# Patient Record
Sex: Female | Born: 1950 | Race: White | Hispanic: No | Marital: Married | Smoking: Current every day smoker
Health system: Southern US, Community
[De-identification: ages and names within clinical notes are randomized; demographics above are authoritative.]

---

## 2002-07-30 ENCOUNTER — Encounter: Payer: Self-pay | Admitting: Emergency Medicine

## 2002-07-30 ENCOUNTER — Emergency Department (HOSPITAL_COMMUNITY): Admission: EM | Admit: 2002-07-30 | Discharge: 2002-07-31 | Payer: Self-pay | Admitting: Emergency Medicine

## 2011-01-27 ENCOUNTER — Emergency Department (HOSPITAL_COMMUNITY): Payer: BC Managed Care – PPO

## 2011-01-27 ENCOUNTER — Emergency Department (HOSPITAL_COMMUNITY)
Admission: EM | Admit: 2011-01-27 | Discharge: 2011-01-27 | Disposition: A | Discharge status: Home / Self Care | Payer: BC Managed Care – PPO | Attending: Emergency Medicine | Admitting: Emergency Medicine

## 2011-01-27 DIAGNOSIS — S92919A Unspecified fracture of unspecified toe(s), initial encounter for closed fracture: Secondary | ICD-10-CM | POA: Insufficient documentation

## 2011-01-27 DIAGNOSIS — Y92009 Unspecified place in unspecified non-institutional (private) residence as the place of occurrence of the external cause: Secondary | ICD-10-CM | POA: Insufficient documentation

## 2011-01-27 DIAGNOSIS — W2209XA Striking against other stationary object, initial encounter: Secondary | ICD-10-CM | POA: Insufficient documentation

## 2011-09-09 IMAGING — CR DG FOOT COMPLETE 3+V*L*
3 series · 3 of 3 positions shown · non-contrast
Comparison: None

CLINICAL DATA: Blunt trauma to the fifth toe.  Pain.

LEFT FOOT - COMPLETE 3+ VIEW

[t foot ap left]
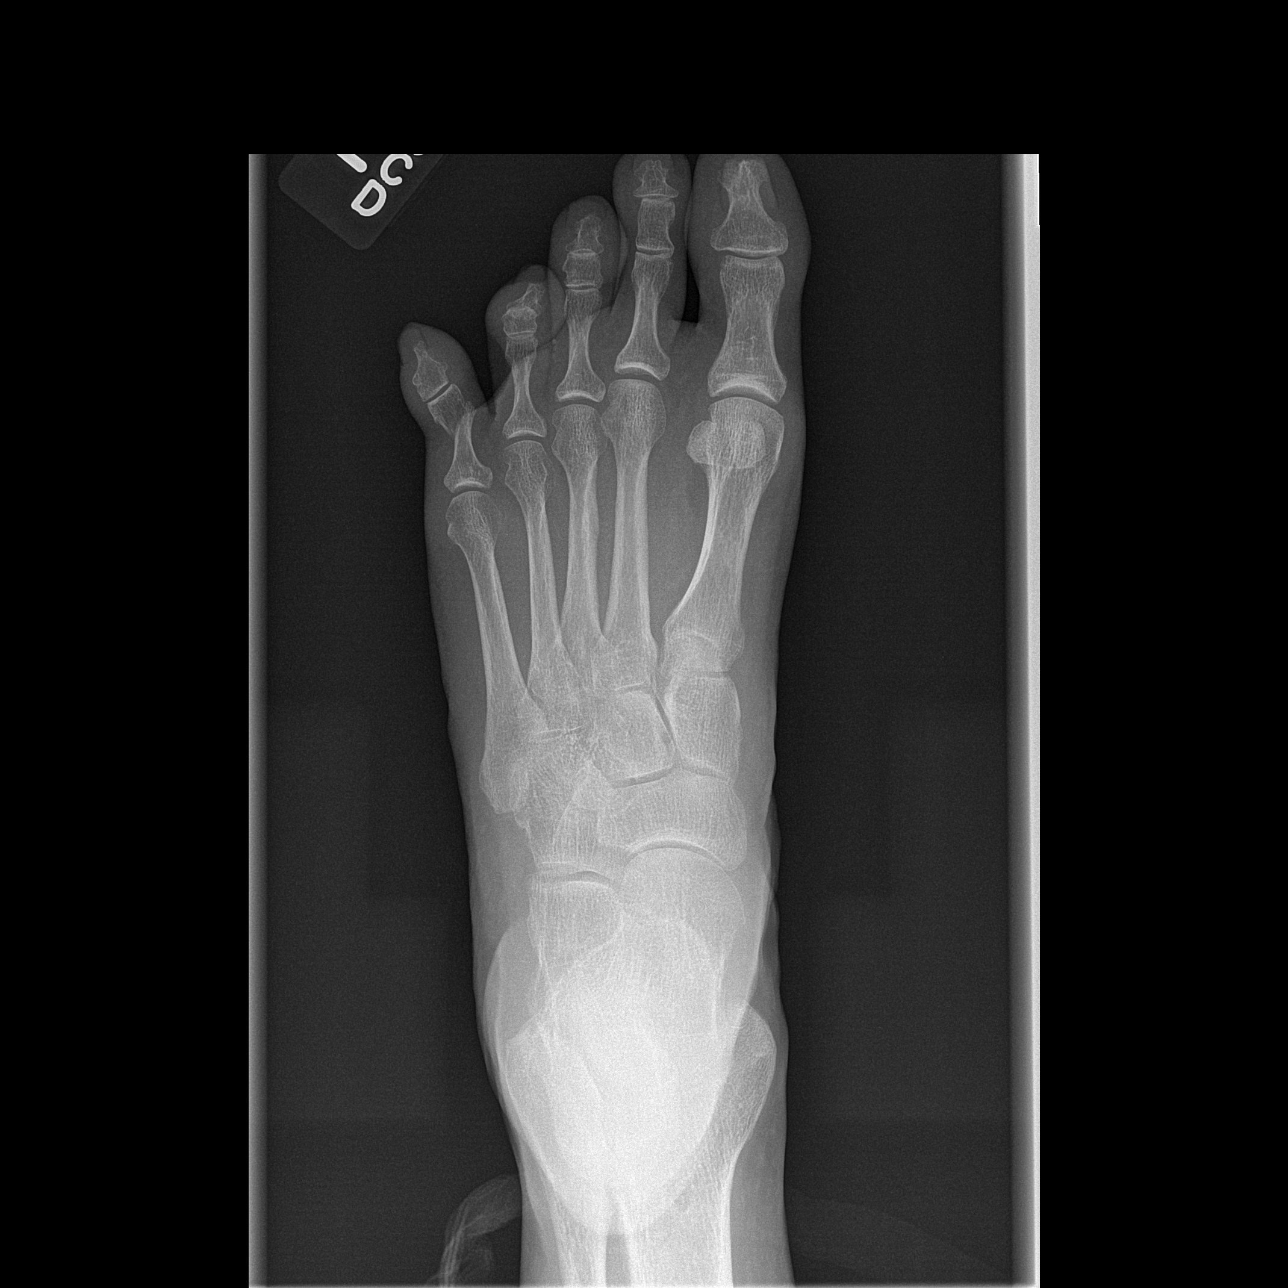

[t foot oblique left]
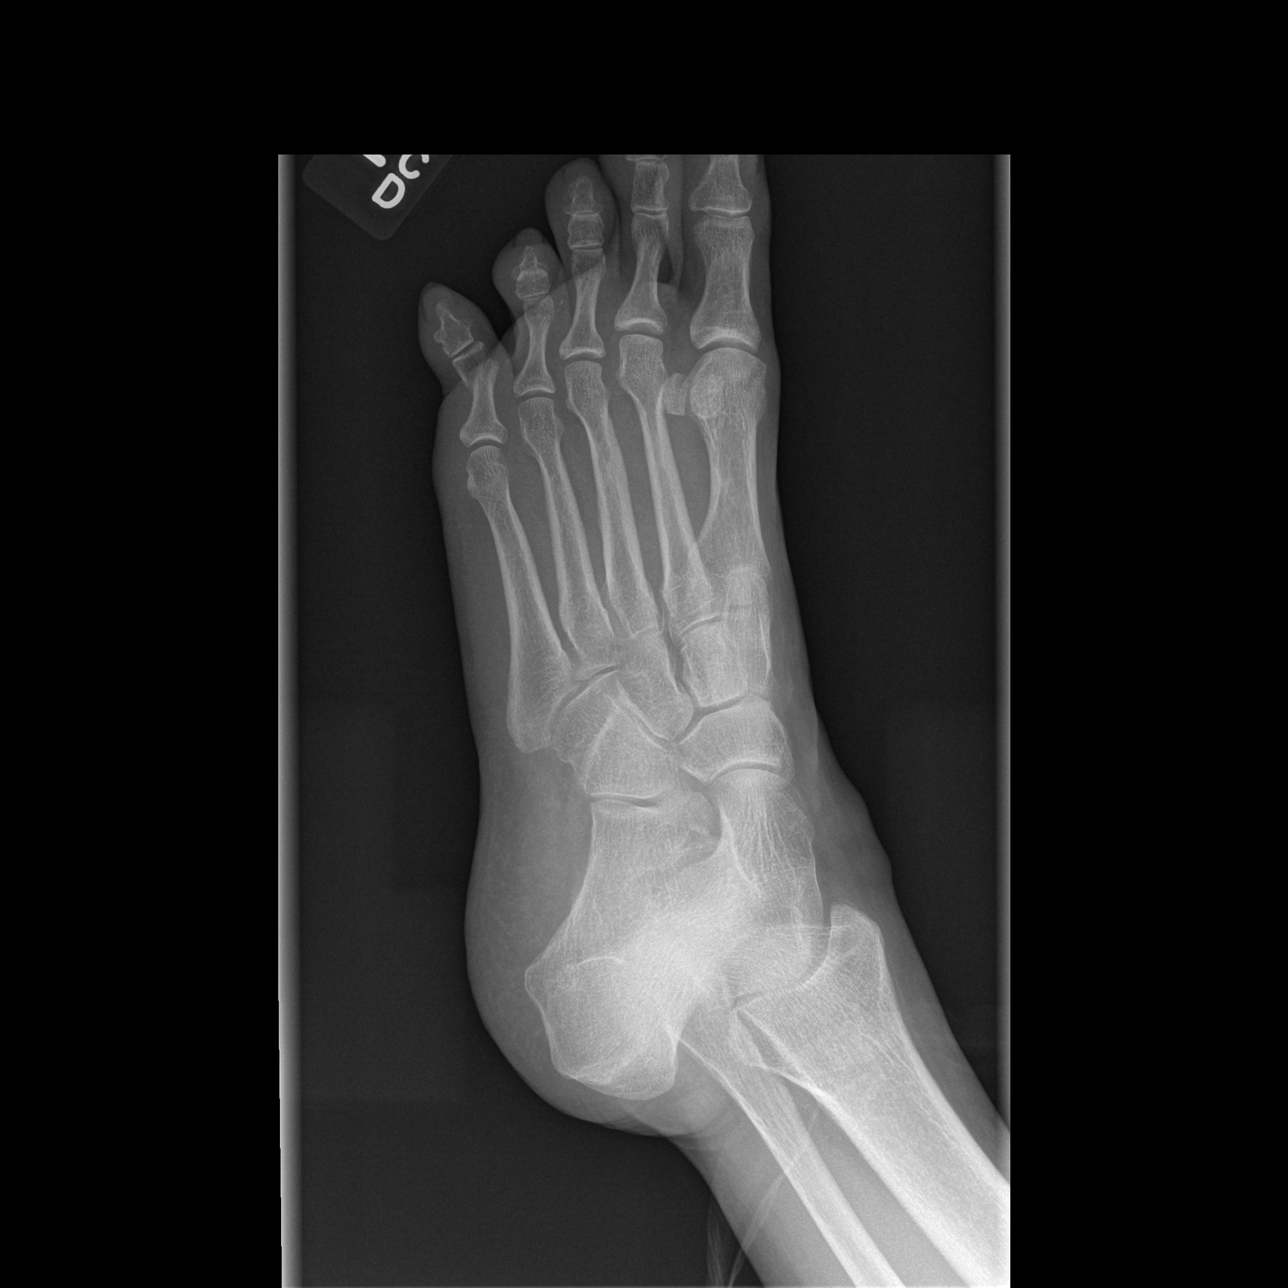

[t foot lat left]
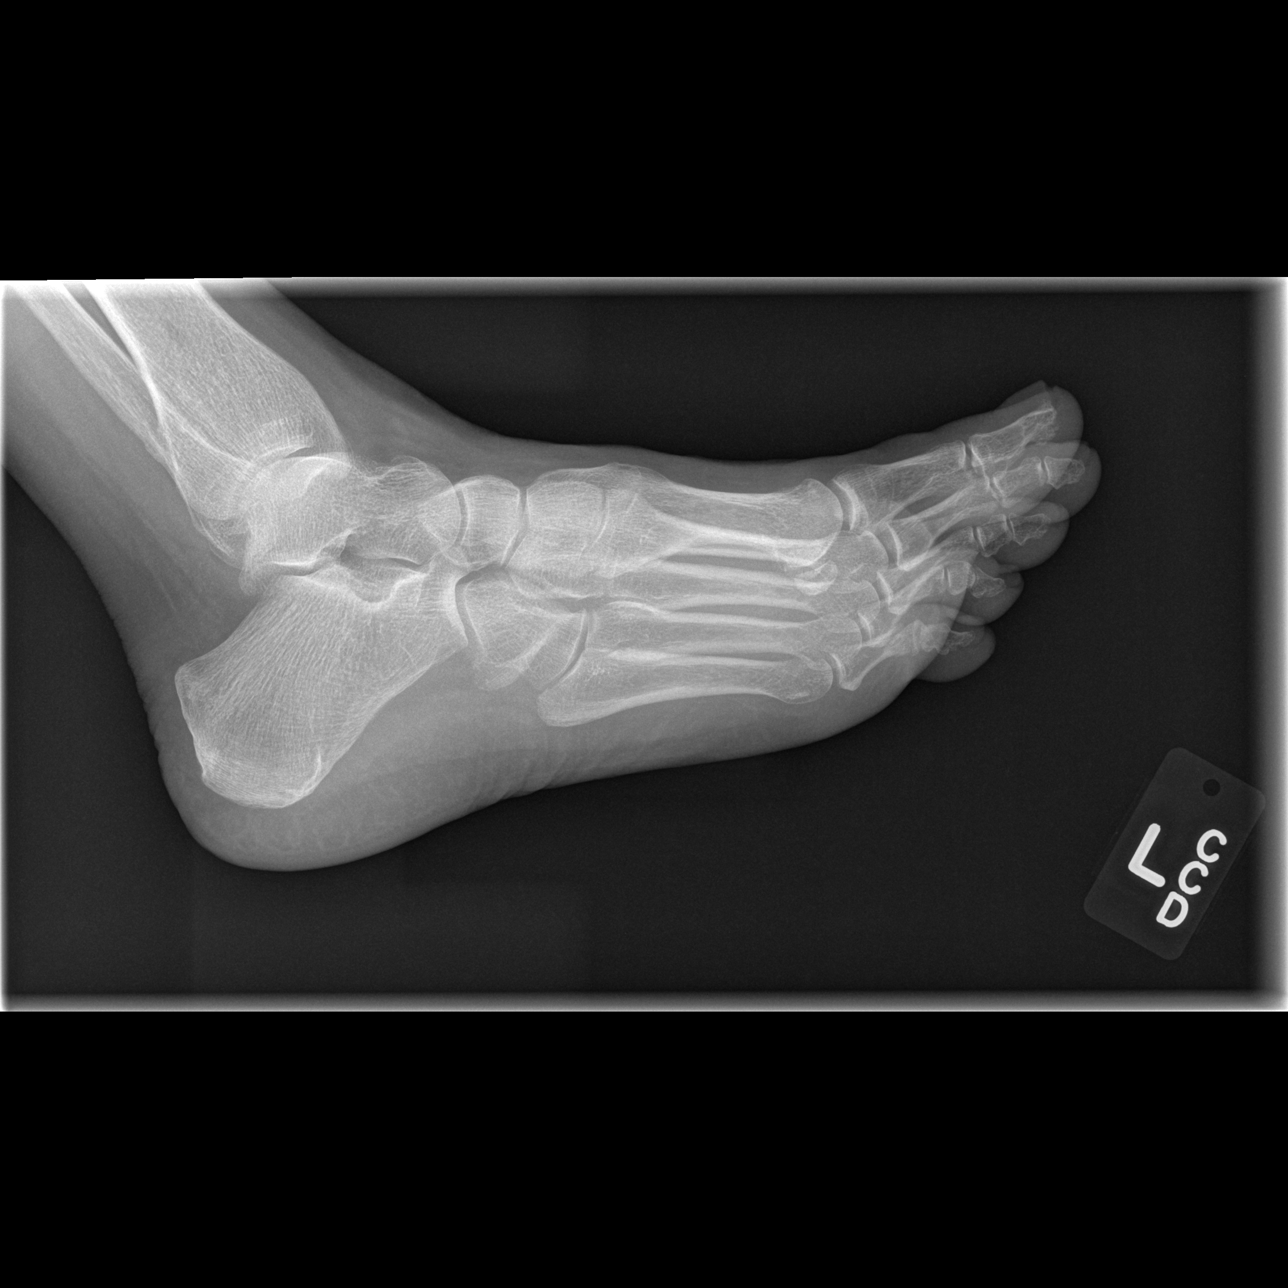

[3 of 3 positions shown; findings below may reference images not displayed]

FINDINGS: The bone mineral density appears decreased.  There is an
oblique fracture through the proximal phalanx of the fifth digit,
associated lateral angulation at the fracture site.  There is
associated soft tissue swelling.  No radiopaque foreign body or
soft tissue gas.  No other fractures identified.
IMPRESSION: 1.  Fracture of the fifth proximal phalanx.
2.  Bones appear osteopenic.

## 2013-09-30 ENCOUNTER — Encounter (HOSPITAL_COMMUNITY): Payer: Self-pay | Admitting: Emergency Medicine

## 2013-09-30 ENCOUNTER — Emergency Department (INDEPENDENT_AMBULATORY_CARE_PROVIDER_SITE_OTHER)
Admission: EM | Admit: 2013-09-30 | Discharge: 2013-09-30 | Disposition: A | Discharge status: Home / Self Care | Payer: BC Managed Care – PPO | Source: Home / Self Care | Attending: Emergency Medicine | Admitting: Emergency Medicine

## 2013-09-30 DIAGNOSIS — J209 Acute bronchitis, unspecified: Secondary | ICD-10-CM

## 2013-09-30 MED ORDER — DOXYCYCLINE HYCLATE 100 MG PO TABS: 100.0000 mg | ORAL_TABLET | Freq: Two times a day (BID) | ORAL | Status: DC

## 2013-09-30 MED ORDER — HYDROCOD POLST-CHLORPHEN POLST 10-8 MG/5ML PO LQCR: 5.0000 mL | Freq: Two times a day (BID) | ORAL | Status: DC | PRN

## 2013-09-30 MED ORDER — PREDNISONE 20 MG PO TABS: 20.0000 mg | ORAL_TABLET | Freq: Two times a day (BID) | ORAL | Status: DC

## 2013-09-30 MED ORDER — ALBUTEROL SULFATE HFA 108 (90 BASE) MCG/ACT IN AERS: 2.0000 | INHALATION_SPRAY | Freq: Four times a day (QID) | RESPIRATORY_TRACT | Status: DC

## 2013-09-30 NOTE — ED Notes (Signed)
C/o severe cough with occasional green mucous, that has caused sore throat, headache, and congestion. Pain is 5/10. X 7 days. Denies any n/v/d.  Stated that she has taken OTC medications with no relief. Has hx of bronchitis. Written by: Marga MelnickQuaNeisha Jones, SMA

## 2013-09-30 NOTE — ED Provider Notes (Signed)
  Chief Complaint   Chief Complaint  Patient presents with  . Bronchitis    History of Present Illness   Barbara Tran is a 63 year old female smoker who has had a one-week history of cough productive of green sputum, sore throat, nasal congestion, rhinorrhea with clear drainage, sneezing, swollen glands, and has felt warm. She denies any wheezing, chest pain, hoarseness, headache, or GI symptoms.  Review of Systems   Other than as noted above, the patient denies any of the following symptoms: Systemic:  No fevers, chills, sweats, or myalgias. Eye:  No redness or discharge. ENT:  No ear pain, headache, nasal congestion, drainage, sinus pressure, or sore throat. Neck:  No neck pain, stiffness, or swollen glands. Lungs:  No cough, sputum production, hemoptysis, wheezing, chest tightness, shortness of breath or chest pain. GI:  No abdominal pain, nausea, vomiting or diarrhea.  PMFSH   Past medical history, family history, social history, meds, and allergies were reviewed. She smokes one pack of cigarettes per day.  Physical exam   Vital signs:  BP 149/69  Pulse 96  Temp(Src) 98.3 F (36.8 C) (Oral)  SpO2 97% General:  Alert and oriented.  In no distress.  Skin warm and dry. Eye:  No conjunctival injection or drainage. Lids were normal. ENT:  TMs and canals were normal, without erythema or inflammation.  Nasal mucosa was clear and uncongested, without drainage.  Mucous membranes were moist.  Pharynx was erythematous with no exudate or drainage.  There were no oral ulcerations or lesions. Neck:  Supple, no adenopathy, tenderness or mass. Lungs:  No respiratory distress.  Lungs were clear to auscultation, without wheezes, rales or rhonchi.  Breath sounds were clear and equal bilaterally.  Heart:  Regular rhythm, without gallops, murmers or rubs. Skin:  Clear, warm, and dry, without rash or lesions.  Assessment     The encounter diagnosis was Acute bronchitis.  Since she is a  smoker, antibiotic treatment is warranted.  Plan    1.  Meds:  The following meds were prescribed:   Discharge Medication List as of 09/30/2013 12:01 PM    START taking these medications   Details  albuterol (PROVENTIL HFA;VENTOLIN HFA) 108 (90 BASE) MCG/ACT inhaler Inhale 2 puffs into the lungs 4 (four) times daily., Starting 09/30/2013, Until Discontinued, Normal    chlorpheniramine-HYDROcodone (TUSSIONEX) 10-8 MG/5ML LQCR Take 5 mLs by mouth every 12 (twelve) hours as needed for cough., Starting 09/30/2013, Until Discontinued, Normal    doxycycline (VIBRA-TABS) 100 MG tablet Take 1 tablet (100 mg total) by mouth 2 (two) times daily., Starting 09/30/2013, Until Discontinued, Normal    predniSONE (DELTASONE) 20 MG tablet Take 1 tablet (20 mg total) by mouth 2 (two) times daily., Starting 09/30/2013, Until Discontinued, Normal        2.  Patient Education/Counseling:  The patient was given appropriate handouts, self care instructions, and instructed in symptomatic relief.  Instructed to get extra fluids, rest, and use a cool mist vaporizer.  She was strongly encouraged to quit smoking.  3.  Follow up:  The patient was told to follow up here if no better in 3 to 4 days, or sooner if becoming worse in any way, and given some red flag symptoms such as increasing fever, difficulty breathing, chest pain, or persistent vomiting which would prompt immediate return.  Follow up here as needed.      Reuben Likesavid C Janoah Menna, MD 09/30/13 1302

## 2013-09-30 NOTE — Discharge Instructions (Signed)
Bronchitis Bronchitis is the body's way of reacting to injury and/or infection (inflammation) of the bronchi. Bronchi are the air tubes that extend from the windpipe into the lungs. If the inflammation becomes severe, it may cause shortness of breath. CAUSES  Inflammation may be caused by:  A virus.  Germs (bacteria).  Dust.  Allergens.  Pollutants and many other irritants. The cells lining the bronchial tree are covered with tiny hairs (cilia). These constantly beat upward, away from the lungs, toward the mouth. This keeps the lungs free of pollutants. When these cells become too irritated and are unable to do their job, mucus begins to develop. This causes the characteristic cough of bronchitis. The cough clears the lungs when the cilia are unable to do their job. Without either of these protective mechanisms, the mucus would settle in the lungs. Then you would develop pneumonia. Smoking is a common cause of bronchitis and can contribute to pneumonia. Stopping this habit is the single most important thing you can do to help yourself. TREATMENT   Your caregiver may prescribe an antibiotic if the cough is caused by bacteria. Also, medicines that open up your airways make it easier to breathe. Your caregiver may also recommend or prescribe an expectorant. It will loosen the mucus to be coughed up. Only take over-the-counter or prescription medicines for pain, discomfort, or fever as directed by your caregiver.  Removing whatever causes the problem (smoking, for example) is critical to preventing the problem from getting worse.  Cough suppressants may be prescribed for relief of cough symptoms.  Inhaled medicines may be prescribed to help with symptoms now and to help prevent problems from returning.  For those with recurrent (chronic) bronchitis, there may be a need for steroid medicines. SEEK IMMEDIATE MEDICAL CARE IF:   During treatment, you develop more pus-like mucus (purulent  sputum).  You have a fever.  You become progressively more ill.  You have increased difficulty breathing, wheezing, or shortness of breath. It is necessary to seek immediate medical care if you are elderly or sick from any other disease. MAKE SURE YOU:   Understand these instructions.  Will watch your condition.  Will get help right away if you are not doing well or get worse. Document Released: 09/10/2005 Document Revised: 05/13/2013 Document Reviewed: 05/05/2013 [facility] Patient Information 2014 [place]. Using Your Inhaler 1. Take the cap off the mouthpiece. 2. Shake the inhaler for 5 seconds. 3. Turn the inhaler so the bottle is above the mouthpiece. Hold it away from your mouth, at a distance of the width of 2 fingers. 4. Open your mouth widely, and tilt your head back slightly. Let your breath out. 5. Take a deep breath in slowly through your mouth. At the same time, push down on the bottle 1 time. You will feel the medicine enter your mouth and throat as you breathe. 6. Continue to take a deep breath in very slowly. 7. After you have breathed in completely, hold your breath for 10 seconds. This will help the medicine to settle in your lungs. If you cannot hold your breath for 10 seconds, hold it for as long as you can before you breathe out. 8. If your doctor has told you to take more than 1 puff, wait at least 1 minute between puffs. This will help you get the best results from your medicine. 9. If you use a steroid inhaler, rinse out your mouth after each dose. 10. Wash your inhaler once a day. Remove the  bottle from the mouthpiece. Rinse the mouthpiece and cap with warm water. Dry everything well before you put the inhaler back together. Document Released: 06/19/2008 Document Revised: 12/03/2011 Document Reviewed: 06/28/2009 [facility] Patient Information 2014 [place].  How to Quit Smoking  According to the U.S. Surgeon General, about 440,000 people in the  [place] alone die from complications related to tobacco use.  More deaths occur due to cigarette smoking than illegal drug use, AIDS, car accidents, alcohol-related deaths, suicide and homicide combined.  Smoking accounts for about 30% of all cancer related deaths, including more than 80% of lung cancer deaths. Smoking has also been linked as the cause of many other diseases like heart disease, bronchitis, emphysema, stroke, and complications of pneumonia as well as causing an increased risk of miscarriage, premature births, stillbirth, infant death, and low birth weight in infants.  For this reason, the U.S. Surgeon General recommends:  "Smoking cessation (stopping smoking) represents the single most important step that smokers can take to enhance the length and quality of their lives."  Why is it so hard to Quit?  Tobacco products contain Nicotine which is highly addictive - probably as addictive as heroin or cocaine.  Over time, your body becomes both physically and psychologically dependent on it. Finally, attempts to quit smoking are complicated by withdrawal reactions like depression, irritability, trouble sleeping, trouble concentrating, restlessness, headaches, weight gain, and excessive fatigue as well as a lack of support.  These symptoms can last from a few days to several weeks.  Why should you Quit?  Live longer and healthier  Can improves the health of your housemates (children, spouse)  Increases your energy and breathing ability  Lowers risk of heart attack, stroke and cancer  Saves money - For example, if you smoke a pack of cigarettes a day and each pack costs about $3.00, then you will save about $1,100.00 per year, about $5,500. in 5 years and $11,000.00 in 10 years.  What you can do: 1. Talk to your health care provider - There are many smoking cessations aids available, both prescription or over-the-counter.  Check with your doctor and pharmacist before taking any  of these products to see which one is best for you.  Develop a plan with your healthcare provider which may include nicotine replacement, prescription medication and/or counseling.  2. Get Started - [place] a start date on your calendar.  Remove cigarettes and ashtrays from your home, car, and office.  Dont be around other smokers.  Stop smokingnot even a puff!  3. Support - Talk to family, friends, and co-workers about your plan to stop smoking.  Ask them not to smoke around you.  4. Coping Strategies  The four As to help during tough times:  a. Avoid - Avoid other smokers or places where smoking is commonplace.  Avoid alcoholic beverages as these may increase your desire to smoke b. Alter - Change your routine.  Drink water/juices instead of alcohol or coffee.  Take a walk or visit with someone during your coffee break.  Change your route to work. c. Alternatives - Substitute raw vegetables like carrot sticks or celery, sugarless candy or gum for the habit of having a cigarette. d. Activities - Start an exercise program (talk to your doctor prior to beginning any exercise program). Try out a new hands on hobby to distract you from smoking and to keep your hands busy like woodworking or needlepoint.   Additional tips for specific withdrawal symptoms:   Cravings  for tobacco:   Distract yourself        Deep-breathing exercises        Remember that cravings are brief    Irritability:    Take a few slow, deep breaths       Soak in a hot bath    Insomnia:    Take a walk several hours before bedtime       Avoid caffeinated beverages after noon       Read a book       Take a warm bath       Banana or warm milk    Increased appetite:   Drink water or low-calorie drinks       Make a survival Kit: Include straws, cinnamon        sticks,        coffee stirrers, licorice, toothpicks, gum, or fresh         vegetables    Inability to concentrate:  Take a brisk walk        Lighten your schedule for a couple of days       Take more breaks   Fatigue:    Get a good nights sleep       Take a nap       Dont overdo it for 2-4 weeks    Constipation, gas,  stomach pain:    Drink plenty of fluids       Increase fiber: fruit, raw vegetables, whole grain        cereals       Talk to your doctor about diet changes    5. Dealing with Relapses - Most relapses occur within the first 2-3 months.  This is common so dont be discouraged.  Some people may take several attempts before they can quit smoking completely.  6. Reward yourself - Set-up a rewards program for every milestone, like 1st month after quitting, 3rd month after quitting, and 6th month after quitting to keep you motivated.  What your doctor can do: Perform a physical exam and order diagnostic tests like laboratory blood work and a chest X-ray.  This will help to identify health related conditions that might benefit from smoking cessation. Review your health history to make sure there are no contraindications with specific smoking cessation aids like allergies to medications or ingredients in these medications or conflicts with your current medications. Prescribe smoking cessation aids such as: Over-the-counter aid:  Nicotine gum, Nicotine patch Prescription aids:  Nicotine spray, Nicotine inhaler, or Bupropion SR (non-nicotine). Offer or recommend individual or group counseling to support you during the initial quitting and maintenance phase of your smoking cessation program. Offer or recommend other alternative treatments like hypnosis or acupuncture.  What you can expect: Benefits from quitting smoking:  Improved Physical Appearance - Minimizes or stops premature wrinkling of skin, bad breath, stained teeth, gum disease, clothes/hair smoke odors, and yellow fingernails  Improved Daily Activities - Food tastes better, sense of smell improves, and decreases shortness of breath during ordinary  activities like climbing stairs, walking, and performing light housework  Decreased Financial Cost - From both no longer purchasing cigarettes and the health care cost for medical treatment of conditions caused by smoking.  Health of Others - Decreases risk of exposing others to the effects of second hand smoke.  Sets an example for youth. Benefits of Quitting according to research from the U.S. Surgeon General:  20 minutes after:  Blood pressure lowers & Body temperature normalizes  8 hours after: Carbon monoxide levels begins to normalize   24 hours after: Heart attack risk decreases  2 weeks to 3 months after:  Blood flow improves & lung function increases   1 to 9 months after:  Coughing, sinus congestion, fatigue, shortness of breath decrease   1 year after: Risk of developing coronary heart disease is half that of a smoker  5 years after: Risk of a stroke decreases to that of a nonsmoker  10 years after: Risk of death due to lung cancer death is halved.  Risk of oral, throat, esophagus, bladder, kidney, and pancreatic cancer decreases.  15 years after: Risk of developing coronary heart disease is half that of a nonsmoker      References:  Here are references that can provide additional information and support:  [place] (800) ACS-2345       (800) Q2878766 or (800) 4032091315 www.cancer.org       www.amhrt.Pharmacist, community Academy of [employer] Acupuncture 810-402-4733 or 534-014-1863  (800904-822-5907 or 7752833460 www.lungusa.org       www.medicalacupuncture.[place]     Office on [place] for Disease Control and Prevention (800) 4-CANCER or (800) Y5278638  (863)353-6535 www.cancer.gov       VoipPolicy.ch  Nicotine Anonymous      Smokefree.gov 970-476-5180) TRY-NICA 716 391 9751)   (531)614-7890) 44U-QUIT  628-422-5578) www.nicotine-anonymous.org   www.smokefree.gov  Contact your doctor or pharmacist if you have specific questions about starting a smoking cessation program.

## 2015-05-10 ENCOUNTER — Emergency Department (HOSPITAL_COMMUNITY)
Admission: EM | Admit: 2015-05-10 | Discharge: 2015-05-10 | Disposition: A | Discharge status: Home / Self Care | Payer: BLUE CROSS/BLUE SHIELD | Source: Home / Self Care | Attending: Emergency Medicine | Admitting: Emergency Medicine

## 2015-05-10 ENCOUNTER — Encounter (HOSPITAL_COMMUNITY): Payer: Self-pay | Admitting: *Deleted

## 2015-05-10 DIAGNOSIS — L237 Allergic contact dermatitis due to plants, except food: Secondary | ICD-10-CM | POA: Diagnosis not present

## 2015-05-10 MED ORDER — HYDROXYZINE HCL 25 MG PO TABS: 25.0000 mg | ORAL_TABLET | Freq: Four times a day (QID) | ORAL | Status: AC | PRN

## 2015-05-10 MED ORDER — METHYLPREDNISOLONE ACETATE 80 MG/ML IJ SUSP
80.0000 mg | Freq: Once | INTRAMUSCULAR | Status: AC
  Administered 2015-05-10: 80 mg via INTRAMUSCULAR

## 2015-05-10 MED ORDER — PREDNISONE 20 MG PO TABS: ORAL_TABLET | ORAL | Status: AC

## 2015-05-10 MED ORDER — METHYLPREDNISOLONE ACETATE 80 MG/ML IJ SUSP
INTRAMUSCULAR | Status: AC
  Filled 2015-05-10: qty 1

## 2015-05-10 NOTE — ED Notes (Signed)
Pt  Reports         She  Was  Outside  Working in  [place]  And  Developed  A  Rash  Probably   fron  Contact  Dermatitis

## 2015-05-10 NOTE — Discharge Instructions (Signed)
You have poison oak. Take prednisone as prescribed. It is important to do the taper to prevent the rash from coming back. Take hydroxyzine every 6 hours as needed for itching. Continue to use calamine lotion as needed. Avoid alcohol on the rash. Follow-up as needed.

## 2015-05-10 NOTE — ED Provider Notes (Signed)
CSN: [id number]     Arrival date & time 05/10/15  1657 History   First MD Initiated Contact with Patient 05/10/15 1801     Chief Complaint  Patient presents with  . Rash   (Consider location/radiation/quality/duration/timing/severity/associated sxs/prior Treatment) HPI  She is a 64 year old woman here for evaluation of rash. She states she was working out in her yard over the weekend. She states she must of come into contact with poison oak because she developed an itchy rash yesterday. She states it is on her arms, neck, and face. She has used rubbing alcohol and calamine lotion with temporary improvement. No fevers. No difficulty swallowing.  History reviewed. No pertinent past medical history. History reviewed. No pertinent past surgical history. History reviewed. No pertinent family history. Social History  Substance Use Topics  . Smoking status: Current Every Day Smoker -- 1.00 packs/day  . Smokeless tobacco: None  . Alcohol Use: No   OB History    No data available     Review of Systems As in history of present illness Allergies  Review of patient's allergies indicates no known allergies.  Home Medications   Prior to Admission medications   Medication Sig Start Date End Date Taking? Authorizing Provider  hydrOXYzine (ATARAX/VISTARIL) 25 MG tablet Take 1 tablet (25 mg total) by mouth every 6 (six) hours as needed for itching. 05/10/15   Charm Rings, MD  predniSONE (DELTASONE) 20 MG tablet Take 3 tablets daily for 5 days, then 2 tablets daily for 3 days, then 1 tablet daily for 3 days, then 1/2 tablet daily for 3 days. 05/10/15   Charm Rings, MD   BP 151/75 mmHg  Pulse 65  Temp(Src) 98.1 F (36.7 C) (Oral)  Resp 16  SpO2 98% Physical Exam  Constitutional: She is oriented to person, place, and time. She appears well-developed and well-nourished. No distress.  Cardiovascular: Normal rate.   Pulmonary/Chest: Effort normal.  Neurological: She is alert and oriented to  person, place, and time.  Skin:  Erythematous status papular vesicular rash on bilateral arms, right neck, and right face.    ED Course  Procedures (including critical care time) Labs Review Labs Reviewed - No data to display  Imaging Review No results found.   MDM   1. Poison oak dermatitis    Depo-Medrol 80 mg IM given.  Treat with prednisone taper and hydroxyzine. Recommended continuation of calamine lotion. Follow-up as needed.    Charm Rings, MD 05/10/15 [phone]
